# Patient Record
Sex: Male | Born: 2000 | Race: White | Hispanic: No | Marital: Single | Smoking: Never smoker
Health system: Southern US, Community
[De-identification: ages and names within clinical notes are randomized; demographics above are authoritative.]

## PROBLEM LIST (undated history)

## (undated) DIAGNOSIS — T7840XA Allergy, unspecified, initial encounter: Secondary | ICD-10-CM

## (undated) DIAGNOSIS — F32A Depression, unspecified: Secondary | ICD-10-CM

## (undated) DIAGNOSIS — J45909 Unspecified asthma, uncomplicated: Secondary | ICD-10-CM

## (undated) DIAGNOSIS — F419 Anxiety disorder, unspecified: Secondary | ICD-10-CM

## (undated) HISTORY — DX: Unspecified asthma, uncomplicated: J45.909

## (undated) HISTORY — DX: Depression, unspecified: F32.A

## (undated) HISTORY — DX: Allergy, unspecified, initial encounter: T78.40XA

## (undated) HISTORY — DX: Anxiety disorder, unspecified: F41.9

---
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
## 2022-07-16 DIAGNOSIS — R452 Unhappiness: Secondary | ICD-10-CM | POA: Diagnosis not present

## 2022-07-16 DIAGNOSIS — F329 Major depressive disorder, single episode, unspecified: Secondary | ICD-10-CM | POA: Diagnosis not present

## 2022-07-16 DIAGNOSIS — Z1331 Encounter for screening for depression: Secondary | ICD-10-CM | POA: Diagnosis not present

## 2022-09-10 DIAGNOSIS — Z23 Encounter for immunization: Secondary | ICD-10-CM | POA: Diagnosis not present

## 2022-09-10 DIAGNOSIS — Z1331 Encounter for screening for depression: Secondary | ICD-10-CM | POA: Diagnosis not present

## 2022-09-10 DIAGNOSIS — Z713 Dietary counseling and surveillance: Secondary | ICD-10-CM | POA: Diagnosis not present

## 2022-09-10 DIAGNOSIS — F329 Major depressive disorder, single episode, unspecified: Secondary | ICD-10-CM | POA: Diagnosis not present

## 2022-09-10 DIAGNOSIS — Z0000 Encounter for general adult medical examination without abnormal findings: Secondary | ICD-10-CM | POA: Diagnosis not present

## 2022-09-10 DIAGNOSIS — Z113 Encounter for screening for infections with a predominantly sexual mode of transmission: Secondary | ICD-10-CM | POA: Diagnosis not present

## 2022-09-10 DIAGNOSIS — R5383 Other fatigue: Secondary | ICD-10-CM | POA: Diagnosis not present

## 2022-09-10 DIAGNOSIS — Z0001 Encounter for general adult medical examination with abnormal findings: Secondary | ICD-10-CM | POA: Diagnosis not present

## 2022-09-10 DIAGNOSIS — Z6823 Body mass index (BMI) 23.0-23.9, adult: Secondary | ICD-10-CM | POA: Diagnosis not present

## 2023-02-17 ENCOUNTER — Encounter: Payer: Self-pay | Admitting: Physician Assistant

## 2023-02-17 ENCOUNTER — Ambulatory Visit: Payer: BC Managed Care – PPO | Admitting: Physician Assistant

## 2023-02-17 VITALS — BP 110/80 | HR 90 | Temp 98.6°F | Ht 67.0 in | Wt 158.6 lb

## 2023-02-17 DIAGNOSIS — F32A Depression, unspecified: Secondary | ICD-10-CM

## 2023-02-17 DIAGNOSIS — M26609 Unspecified temporomandibular joint disorder, unspecified side: Secondary | ICD-10-CM

## 2023-02-17 DIAGNOSIS — J029 Acute pharyngitis, unspecified: Secondary | ICD-10-CM | POA: Diagnosis not present

## 2023-02-17 DIAGNOSIS — J4520 Mild intermittent asthma, uncomplicated: Secondary | ICD-10-CM

## 2023-02-17 DIAGNOSIS — F419 Anxiety disorder, unspecified: Secondary | ICD-10-CM | POA: Diagnosis not present

## 2023-02-17 DIAGNOSIS — F5101 Primary insomnia: Secondary | ICD-10-CM | POA: Diagnosis not present

## 2023-02-17 LAB — POCT RAPID STREP A (OFFICE): Rapid Strep A Screen: POSITIVE — AB

## 2023-02-17 MED ORDER — FLUTICASONE-SALMETEROL 100-50 MCG/ACT IN AEPB: 1.0000 | INHALATION_SPRAY | Freq: Two times a day (BID) | RESPIRATORY_TRACT | 3 refills | Status: AC

## 2023-02-17 MED ORDER — AMOXICILLIN 875 MG PO TABS: 875.0000 mg | ORAL_TABLET | Freq: Two times a day (BID) | ORAL | 0 refills | Status: AC

## 2023-02-17 NOTE — Patient Instructions (Signed)
It was great to see you!  Melatonin and/or magnesium for sleep Message me in 1-2 weeks via MyChart to let me know how this is doing for you  Start Advair inhaler in AM and PM during acute illness If you notice that you need to use your albuterol inhaler often, restart the Advair  Continue Lexapro 5 mg daily  Start amoxicillin for your strep and ear infection  Let's follow-up in 3-6 months for Comprehensive Physical Exam (CPE) preventive care annual visit, sooner if you have concerns.  Take care,  Jarold Motto PA-C

## 2023-02-17 NOTE — Progress Notes (Signed)
Antonio Shaffer is a 22 y.o. male here to establish care.  History of Present Illness:   Chief Complaint  Patient presents with   Establish Care   ear pressure    Pt c/o bilateral ear pressure and coughing up dark yellow mucous since last Friday.   Insomnia    Pt c/o issues with falling asleep.   Depression   HPI  Ear Pressure: Complains of mild bilateral ear pressure and coughing productively with dark yellow mucous since 10/11.  Endorses associated sinus congestion, post-nasal drip, sore throat, and fever of 99 (notes he usually runs 97). Chest tightness with deep breathes. Possible sick contacts through schoolmates.  Reports taking Ibuprofen, allergy medication, and Sudafed to manage symptoms. Also takes generic Antonio Shaffer daily to manage severe allergies.  States he's had to use inhaler 3 times due to symptoms.   Insomnia: Complains of difficulty falling asleep and waking up.  Describes being awake for 1 hour after laying down to fall asleep, and waking up fatigued even after sleeping.  Notes when trying to fall asleep he will mentally trace out the letter Z, and will also imagine himself falling asleep.  Endorses excessive caffeine consumption; notes he still experiences difficulty falling asleep even on days with no caffeine consumption.   States he hasn't tried Melatonin or Benadryl yet.   Denies difficulty staying asleep or getting up to go to the bathroom.    Depression/Anxiety: Reports first noticing symptoms his junior year of highschool during Covid.  Currently managed with 5 mg Antonio Shaffer since his freshman year of college. Previously managed with Zoloft (25 & 50 mg) -- caused emotional side effects. Also established with a support group.  Endorses rare panic attacks (2 that he remembers). Recently had a panic attack on 10/9, and has had to drop classes due to anxiety.  States he hasn't had break from schooling for several years, which has contributed.  Notes finger biting  and hair picking behaviors when severely stressed. Denies biting hard enough to break skin When severely stressed will bite fingers hard enough to leave marks for a couple of hours but never breaks skin, and also picks at his arm/leg hair.  Denies SI/HI.   Asthma: Managed with Albuterol inhaler.  Reports he's never been on a daily inhaler, and only ever needed a nebulizer in childhood. Endorses increase in usage.   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  Endorses bilateral popping, and probable subconscious jaw clenching.  Notes his jaw does sometimes feels misaligned.  States frequency has remained the same. Denies pain, nocturnal teeth grinding, or trauma to area.    Past Medical History:  Diagnosis Date   Allergies    Anxiety    Asthma    Depression     Social History   Tobacco Use   Smoking status: Never   Smokeless tobacco: Never   History reviewed. No pertinent surgical history. History reviewed. No pertinent family history. No Known Allergies Current Medications:   Current Outpatient Medications:    albuterol (VENTOLIN HFA) 108 (90 Base) MCG/ACT inhaler, Inhale 1-2 puffs into the lungs every 6 (six) hours as needed for wheezing or shortness of breath., Disp: , Rfl:    amoxicillin (AMOXIL) 875 MG tablet, Take 1 tablet (875 mg total) by mouth 2 (two) times daily for 10 days., Disp: 20 tablet, Rfl: 0   escitalopram (Antonio Shaffer) 5 MG tablet, Take 1 tablet by mouth daily., Disp: , Rfl:  fluticasone-salmeterol (ADVAIR) 100-50 MCG/ACT AEPB, Inhale 1 puff into the lungs 2 (two) times daily., Disp: 1 each, Rfl: 3  Review of Systems:   ROS See pertinent positives and negatives as per the HPI.  Vitals:   Vitals:   02/17/23 1404  BP: 110/80  Pulse: 90  Temp: 98.6 F (37 C)  SpO2: 96%  Weight: 158 lb 9.6 oz (71.9 kg)  Height: 5\' 7"  (1.702 m)     Body mass index is 24.84 kg/m.  Physical Exam:   Physical Exam Vitals and  nursing note reviewed.  Constitutional:      General: Antonio Shaffer is not in acute distress.    Appearance: Antonio Shaffer is well-developed. Antonio Shaffer is not ill-appearing or toxic-appearing.  HENT:     Head: Normocephalic and atraumatic.     Right Ear: Ear canal and external ear normal. A middle ear effusion is present. Tympanic membrane is not erythematous, retracted or bulging.     Left Ear: Ear canal and external ear normal. A middle ear effusion is present. Tympanic membrane is erythematous. Tympanic membrane is not retracted or bulging.     Nose: Nose normal.     Right Sinus: No maxillary sinus tenderness or frontal sinus tenderness.     Left Sinus: No maxillary sinus tenderness or frontal sinus tenderness.     Mouth/Throat:     Pharynx: Uvula midline. No posterior oropharyngeal erythema.  Eyes:     General: Lids are normal.     Conjunctiva/sclera: Conjunctivae normal.  Neck:     Trachea: Trachea normal.     Comments: Clicking and popping of jaw with opening an closing Cardiovascular:     Rate and Rhythm: Normal rate and regular rhythm.     Heart sounds: Normal heart sounds, S1 normal and S2 normal.  Pulmonary:     Effort: Pulmonary effort is normal.     Breath sounds: Examination of the left-lower field reveals wheezing. Wheezing present. No decreased breath sounds, rhonchi or rales.  Lymphadenopathy:     Cervical: No cervical adenopathy.  Skin:    General: Skin is warm and dry.  Neurological:     Mental Status: Antonio Shaffer is alert.  Psychiatric:        Speech: Speech normal.        Behavior: Behavior normal. Behavior is cooperative.    Results for orders placed or performed in visit on 02/17/23  POCT rapid strep A  Result Value Ref Range   Rapid Strep A Screen Positive (A) Negative     Assessment and Plan:   Mild intermittent asthma without complication Slight wheeze on exam with current upper respiratory infection (URI) Recommend inhaled steroid - Advair sent in for  patient - recommend use in am and pm x 1 week or so or if needed for future upper respiratory infection (URI) If symptom(s) remain uncontrolled, recommend close follow-up  Anxiety and depression Overall controlled Continue Antonio Shaffer 5 mg daily Follow-up in 3-6 month(s), sooner if concerns I discussed with patient that if they develop any SI, to tell someone immediately and seek medical attention.  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  I, Jarold Motto, [place], have reviewed all documentation for this visit. The documentation on 02/17/23 for the exam, diagnosis, procedures, and orders are all accurate and complete.  Jarold Motto, PA-C

## 2023-03-11 DIAGNOSIS — J329 Chronic sinusitis, unspecified: Secondary | ICD-10-CM | POA: Diagnosis not present

## 2023-04-18 NOTE — Progress Notes (Signed)
Antonio Shaffer is a 22 y.o. male here for a follow up of a pre-existing problem.  History of Present Illness:   No chief complaint on file.   HPI  Insomnia    Past Medical History:  Diagnosis Date   Allergies    Anxiety    Asthma    Depression      Social History   Tobacco Use   Smoking status: Never   Smokeless tobacco: Never    No past surgical history on file.  No family history on file.  No Known Allergies  Current Medications:   Current Outpatient Medications:    albuterol (VENTOLIN HFA) 108 (90 Base) MCG/ACT inhaler, Inhale 1-2 puffs into the lungs every 6 (six) hours as needed for wheezing or shortness of breath., Disp: , Rfl:    escitalopram (LEXAPRO) 5 MG tablet, Take 1 tablet by mouth daily., Disp: , Rfl:    fluticasone-salmeterol (ADVAIR) 100-50 MCG/ACT AEPB, Inhale 1 puff into the lungs 2 (two) times daily., Disp: 1 each, Rfl: 3   Review of Systems:   ROS  Vitals:   There were no vitals filed for this visit.   There is no height or weight on file to calculate BMI.  Physical Exam:   Physical Exam  Assessment and Plan:   ***   I,Eleanor Ruley,acting as a scribe for Jarold Motto, PA.,have documented all relevant documentation on the behalf of Jarold Motto, PA,as directed by  Jarold Motto, PA while in the presence of Jarold Motto, [place].   ***  Jarold Motto, PA-C

## 2023-04-21 ENCOUNTER — Ambulatory Visit: Payer: BC Managed Care – PPO | Admitting: Physician Assistant

## 2023-04-23 ENCOUNTER — Encounter: Payer: Self-pay | Admitting: Physician Assistant

## 2023-04-23 ENCOUNTER — Ambulatory Visit (INDEPENDENT_AMBULATORY_CARE_PROVIDER_SITE_OTHER): Payer: BC Managed Care – PPO | Admitting: Physician Assistant

## 2023-04-23 VITALS — BP 130/80 | HR 71 | Temp 97.8°F | Ht 67.0 in | Wt 158.4 lb

## 2023-04-23 DIAGNOSIS — F5101 Primary insomnia: Secondary | ICD-10-CM | POA: Diagnosis not present

## 2023-04-23 DIAGNOSIS — F419 Anxiety disorder, unspecified: Secondary | ICD-10-CM

## 2023-04-23 DIAGNOSIS — F32A Depression, unspecified: Secondary | ICD-10-CM | POA: Diagnosis not present

## 2023-04-23 DIAGNOSIS — R41840 Attention and concentration deficit: Secondary | ICD-10-CM | POA: Diagnosis not present

## 2023-04-23 MED ORDER — TRAZODONE HCL 50 MG PO TABS: 25.0000 mg | ORAL_TABLET | Freq: Every evening | ORAL | 3 refills | Status: DC | PRN

## 2023-04-23 NOTE — Patient Instructions (Signed)
It was great to see you!  Try to take your Lexapro 5 mg in am and see if this makes a difference  Consider trazodone 25-50 mg nightly as needed for sleep -- I have sent this in for you  The other medication we discussed was hydroxyzine  Consider ADHD testing  Let's follow-up in 1-2 months, sooner if you have concerns.  Take care,  Jarold Motto PA-C

## 2023-07-04 ENCOUNTER — Encounter: Payer: BC Managed Care – PPO | Admitting: Physician Assistant

## 2023-09-25 ENCOUNTER — Encounter: Payer: Self-pay | Admitting: Podiatry

## 2023-09-25 ENCOUNTER — Ambulatory Visit (INDEPENDENT_AMBULATORY_CARE_PROVIDER_SITE_OTHER): Admitting: Podiatry

## 2023-09-25 VITALS — Ht 67.0 in | Wt 160.0 lb

## 2023-09-25 DIAGNOSIS — L600 Ingrowing nail: Secondary | ICD-10-CM

## 2023-09-25 NOTE — Patient Instructions (Signed)

## 2023-09-29 NOTE — Progress Notes (Signed)
 Subjective:   Patient ID: Antonio Shaffer, male   DOB: 23 y.o.   MRN: [id number]   HPI Patient presents with chronic ingrown toenail of the big toes both feet and presents with mother today.  States that it has been sore and hard to wear shoe gear comfortable.  Patient is active does not smoke   Review of Systems  All other systems reviewed and are negative.       Objective:  Physical Exam Vitals and nursing note reviewed.  Constitutional:      Appearance: Antonio Shaffer is well-developed.  Pulmonary:     Effort: Pulmonary effort is normal.  Musculoskeletal:        General: Normal range of motion.  Skin:    General: Skin is warm.  Neurological:     Mental Status: Antonio Shaffer is alert.   Neurovascular status intact muscle strength adequate range of motion adequate with incurvated hallux nail borders medial and lateral of the right and lateral left that are painful when pressed slight redness no active drainage noted.  Good digital perfusion well-oriented x 3 .       Assessment:  Chronic ingrown toenail deformity hallux bilateral     Plan:  H NP reviewed condition discussed with him and mother what can be done and they want this fixed permanently.  At this point I allowed them to read then signed consent for and I went ahead today and that infiltrated each big toe 60 mg like Marcaine mixture sterile prep done using sterile instrumentation removed the borders bilateral exposed matrix applied phenol 3 applications 30 seconds followed by alcohol lavage sterile dressing to each border and instructed on soaks.  Patient to be seen back to recheck all questions answered today

## 2023-12-19 ENCOUNTER — Other Ambulatory Visit: Payer: Self-pay | Admitting: Physician Assistant

## 2024-03-19 ENCOUNTER — Other Ambulatory Visit: Payer: Self-pay | Admitting: Physician Assistant

## 2024-04-10 ENCOUNTER — Other Ambulatory Visit: Payer: Self-pay | Admitting: Physician Assistant

## 2024-04-14 ENCOUNTER — Other Ambulatory Visit: Payer: Self-pay | Admitting: Physician Assistant

## 2024-04-15 ENCOUNTER — Telehealth: Payer: Self-pay | Admitting: *Deleted

## 2024-04-15 ENCOUNTER — Telehealth: Payer: Self-pay | Admitting: Physician Assistant

## 2024-04-15 NOTE — Telephone Encounter (Unsigned)
 Copied from CRM #[id number]. Topic: Clinical - Medication Question >> Apr 15, 2024  4:12 PM Viola F wrote: Reason for CRM: Patient mother called to follow up on refill for the escitalopram (LEXAPRO) 5 MG tablet - appt scheduled for 04/23/24

## 2024-04-15 NOTE — Telephone Encounter (Signed)
LVM to schedule OV for med refills

## 2024-04-16 MED ORDER — ESCITALOPRAM OXALATE 5 MG PO TABS: 5.0000 mg | ORAL_TABLET | Freq: Every day | ORAL | 0 refills | Status: DC

## 2024-04-16 NOTE — Telephone Encounter (Signed)
 Left detailed message on personal voicemail Rx for Lexapro sent to [facility]. Any questions call office otherwise we will see you on 12/19 for your appt. Rx sent

## 2024-04-23 ENCOUNTER — Ambulatory Visit: Admitting: Physician Assistant

## 2024-04-23 VITALS — BP 110/70 | HR 67 | Temp 98.4°F | Ht 67.0 in | Wt 168.0 lb

## 2024-04-23 DIAGNOSIS — F419 Anxiety disorder, unspecified: Secondary | ICD-10-CM | POA: Diagnosis not present

## 2024-04-23 DIAGNOSIS — F5101 Primary insomnia: Secondary | ICD-10-CM | POA: Diagnosis not present

## 2024-04-23 DIAGNOSIS — Z23 Encounter for immunization: Secondary | ICD-10-CM | POA: Diagnosis not present

## 2024-04-23 DIAGNOSIS — R41840 Attention and concentration deficit: Secondary | ICD-10-CM | POA: Diagnosis not present

## 2024-04-23 DIAGNOSIS — F32A Depression, unspecified: Secondary | ICD-10-CM

## 2024-04-23 MED ORDER — BUPROPION HCL ER (XL) 150 MG PO TB24: 150.0000 mg | ORAL_TABLET | Freq: Every day | ORAL | 1 refills | Status: AC

## 2024-04-23 NOTE — Patient Instructions (Signed)
" °  VISIT SUMMARY: Today, we discussed your ongoing issues with insomnia, anxiety, depression, and ADHD symptoms. We made some changes to your medications to better address these concerns and administered your flu shot.  YOUR PLAN: ANXIETY AND DEPRESSION: You have been experiencing anxiety and depression, and have been taking escitalopram  which you feel works but are interested in trying Wellbutrin. -We have discontinued escitalopram  and started you on Wellbutrin 150 mg once daily in the morning with food. -Please monitor for any side effects, including increased anxiety or suicidal thoughts. -Inform a trusted individual about the medication change so they can help monitor your well-being.  PRIMARY INSOMNIA: You have persistent insomnia with physical fatigue but mental hyperactivity. -Monitor your sleep patterns after starting Wellbutrin to see if it helps with your insomnia.  ATTENTION AND CONCENTRATION DEFICIT: You have symptoms suggestive of ADHD, which may be affecting your focus and motivation. -Monitor for improvement in your attention and concentration with Wellbutrin.  GENERAL HEALTH MAINTENANCE: You are due for routine health maintenance. -We administered your flu shot today. -Plan for blood work and a physical exam in the summer.                      Contains text generated by [employer].                                 Contains text generated by [employer].   "

## 2024-04-23 NOTE — Progress Notes (Signed)
 "  History of Present Illness:   Chief Complaint  Patient presents with   Medication Refill    Pt needs refill for Lexapro  and would like to discuss changing medication due to not help much anymore.    Discussed the use of AI scribe software for clinical note transcription with the patient, who gave verbal consent to proceed.  History of Present Illness   Antonio Shaffer is a 23 year old male who presents for medication management of insomnia and ADHD symptoms.  They have persistent insomnia with physical fatigue but mental hyperactivity. Trazodone  previously caused an intolerable sensation of feeling trapped in my body and was stopped.  They have depression and anxiety that were first treated with Zoloft in high school, which was changed to escitalopram  due to poor response. They are on a low dose of escitalopram  that they feel works but they are interested in other medication options.  Family history is notable for both parents taking Wellbutrin and an aunt taking Remeron.  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  They have asthma but have not needed Advair or their rescue inhaler recently and report no current respiratory symptoms.        Past Medical History:  Diagnosis Date   Allergies    Anxiety    Asthma    Depression      Social History[1]  No past surgical history on file.  No family history on file.  Allergies[2]  Current Medications:  Current Medications[3]   Review of Systems:   Negative unless otherwise specified per HPI.  Vitals:   Vitals:   04/23/24 1359  BP: 110/70  Pulse: 67  Temp: 98.4 F (36.9 C)  TempSrc: Temporal  SpO2: 96%  Weight: 168 lb (76.2 kg)  Height: 5' 7 (1.702 m)     Body mass index is 26.31 kg/m.  Physical Exam:   Physical Exam Vitals and nursing note reviewed.  Constitutional:       Appearance: Antonio Shaffer is well-developed.  HENT:     Head: Normocephalic.  Eyes:     Conjunctiva/sclera: Conjunctivae normal.     Pupils: Pupils are equal, round, and reactive to light.  Pulmonary:     Effort: Pulmonary effort is normal.  Musculoskeletal:        General: Normal range of motion.     Cervical back: Normal range of motion.  Skin:    General: Skin is warm and dry.  Neurological:     Mental Status: Antonio Shaffer is alert and oriented to person, place, and time.  Psychiatric:        Behavior: Behavior normal.        Thought Content: Thought content normal.        Judgment: Judgment normal.     Assessment and Plan:   Assessment and Plan    Anxiety and depression Currently on escitalopram  5 mg, effective but interested in Wellbutrin. Discussed Wellbutrin's benefits for depression, anxiety, and ADHD symptoms. Explained potential side effects, including increased anxiety or suicidal thoughts. - Discontinued escitalopram . - Initiated Wellbutrin 150 mg once daily in the morning with food. - Instructed to monitor for side effects, including increased anxiety or suicidal thoughts. - Advised to inform a trusted individual about the medication change for monitoring. - follow up via MyChart in 1-2 months with status of medication efficacy -- sooner if concerns  Primary insomnia Discussed Remeron as  an option but noted weight gain as a side effect. - Monitor sleep patterns after starting Wellbutrin. - follow up via MyChart in 1-2 months with status of medication efficacy -- sooner if concerns  Attention and concentration deficit Symptoms suggestive of ADHD. Wellbutrin may help with focus and motivation. - Monitor for improvement in attention and concentration with Wellbutrin. - recommend that they still pursue evaluation for Attention Deficit Hyperactivity Disorder (ADHD)   General health maintenance No recent blood work or physical exam. Plans for flu shot today. -  Administered flu shot. - Plan for blood work and physical exam in the summer.       Lucie Buttner, PA-C    [1]  Social History Tobacco Use   Smoking status: Never   Smokeless tobacco: Never  [2] No Known Allergies [3]  Current Outpatient Medications:    albuterol (VENTOLIN HFA) 108 (90 Base) MCG/ACT inhaler, Inhale 1-2 puffs into the lungs every 6 (six) hours as needed for wheezing or shortness of breath., Disp: , Rfl:    buPROPion (WELLBUTRIN XL) 150 MG 24 hr tablet, Take 1 tablet (150 mg total) by mouth daily., Disp: 90 tablet, Rfl: 1   cetirizine (ZYRTEC) 10 MG tablet, Take 1 tablet by mouth daily., Disp: , Rfl:    escitalopram  (LEXAPRO ) 5 MG tablet, Take 1 tablet (5 mg total) by mouth daily., Disp: 30 tablet, Rfl: 0   fluticasone -salmeterol (ADVAIR) 100-50 MCG/ACT AEPB, Inhale 1 puff into the lungs 2 (two) times daily., Disp: 1 each, Rfl: 3  "

## 2024-10-22 ENCOUNTER — Ambulatory Visit: Admitting: Physician Assistant
